# Patient Record
Sex: Female | Born: 1970 | Race: White | Hispanic: No | Marital: Single | Smoking: Current every day smoker
Health system: Southern US, Community
[De-identification: ages and names within clinical notes are randomized; demographics above are authoritative.]

## PROBLEM LIST (undated history)

## (undated) DIAGNOSIS — J449 Chronic obstructive pulmonary disease, unspecified: Secondary | ICD-10-CM

## (undated) DIAGNOSIS — R011 Cardiac murmur, unspecified: Secondary | ICD-10-CM

## (undated) HISTORY — PX: CHOLECYSTECTOMY: SHX55

---

## 2004-05-10 ENCOUNTER — Emergency Department: Payer: Self-pay | Admitting: Internal Medicine

## 2006-03-28 ENCOUNTER — Emergency Department: Payer: Self-pay | Admitting: Emergency Medicine

## 2006-04-30 ENCOUNTER — Emergency Department: Payer: Self-pay

## 2007-01-13 ENCOUNTER — Emergency Department: Payer: Self-pay | Admitting: Emergency Medicine

## 2008-02-23 ENCOUNTER — Other Ambulatory Visit: Payer: Self-pay

## 2008-02-23 ENCOUNTER — Emergency Department: Payer: Self-pay | Admitting: Internal Medicine

## 2008-03-04 ENCOUNTER — Emergency Department (HOSPITAL_COMMUNITY): Admission: EM | Admit: 2008-03-04 | Discharge: 2008-03-04 | Payer: Self-pay | Admitting: Emergency Medicine

## 2009-01-25 ENCOUNTER — Emergency Department (HOSPITAL_COMMUNITY): Admission: EM | Admit: 2009-01-25 | Discharge: 2009-01-25 | Payer: Self-pay | Admitting: Emergency Medicine

## 2009-06-28 ENCOUNTER — Emergency Department (HOSPITAL_COMMUNITY): Admission: EM | Admit: 2009-06-28 | Discharge: 2009-06-29 | Payer: Self-pay | Admitting: Emergency Medicine

## 2010-12-20 ENCOUNTER — Emergency Department (HOSPITAL_COMMUNITY)
Admission: EM | Admit: 2010-12-20 | Discharge: 2010-12-20 | Disposition: A | Discharge status: Home / Self Care | Payer: Self-pay | Attending: Emergency Medicine | Admitting: Emergency Medicine

## 2010-12-20 DIAGNOSIS — J45909 Unspecified asthma, uncomplicated: Secondary | ICD-10-CM | POA: Insufficient documentation

## 2010-12-20 DIAGNOSIS — Y92009 Unspecified place in unspecified non-institutional (private) residence as the place of occurrence of the external cause: Secondary | ICD-10-CM | POA: Insufficient documentation

## 2010-12-20 DIAGNOSIS — R404 Transient alteration of awareness: Secondary | ICD-10-CM | POA: Insufficient documentation

## 2011-11-14 ENCOUNTER — Emergency Department: Payer: Self-pay | Admitting: Emergency Medicine

## 2011-11-14 LAB — URINALYSIS, COMPLETE
Ketone: NEGATIVE
RBC,UR: 3 /HPF (ref 0–5)
WBC UR: 5 /HPF (ref 0–5)

## 2011-11-14 LAB — COMPREHENSIVE METABOLIC PANEL
Albumin: 3.6 g/dL (ref 3.4–5.0)
Anion Gap: 5 — ABNORMAL LOW (ref 7–16)
BUN: 11 mg/dL (ref 7–18)
Bilirubin,Total: 0.3 mg/dL (ref 0.2–1.0)
Chloride: 102 mmol/L (ref 98–107)
Co2: 30 mmol/L (ref 21–32)
EGFR (African American): >60
Osmolality: 274 (ref 275–301)
Potassium: 4.2 mmol/L (ref 3.5–5.1)
Sodium: 137 mmol/L (ref 136–145)

## 2011-11-14 LAB — CBC
HCT: 43.5 % (ref 35.0–47.0)
MCHC: 33.1 g/dL (ref 32.0–36.0)
RBC: 4.49 10*6/uL (ref 3.80–5.20)

## 2011-11-14 LAB — PREGNANCY, URINE: Pregnancy Test, Urine: NEGATIVE m[IU]/mL

## 2011-11-14 LAB — LIPASE, BLOOD: Lipase: 129 U/L (ref 73–393)

## 2013-01-12 ENCOUNTER — Emergency Department: Payer: Self-pay | Admitting: Emergency Medicine

## 2014-01-21 ENCOUNTER — Emergency Department: Payer: Self-pay | Admitting: Emergency Medicine

## 2014-01-22 LAB — COMPREHENSIVE METABOLIC PANEL
ALK PHOS: 65 U/L
ANION GAP: 2 — AB (ref 7–16)
Albumin: 3.4 g/dL (ref 3.4–5.0)
BUN: 11 mg/dL (ref 7–18)
Bilirubin,Total: 0.2 mg/dL (ref 0.2–1.0)
CHLORIDE: 110 mmol/L — AB (ref 98–107)
Calcium, Total: 7.9 mg/dL — ABNORMAL LOW (ref 8.5–10.1)
Co2: 29 mmol/L (ref 21–32)
Creatinine: 0.74 mg/dL (ref 0.60–1.30)
EGFR (African American): >60
GLUCOSE: 85 mg/dL (ref 65–99)
OSMOLALITY: 280 (ref 275–301)
POTASSIUM: 4.1 mmol/L (ref 3.5–5.1)
SGOT(AST): 18 U/L (ref 15–37)
SGPT (ALT): 15 U/L (ref 12–78)
SODIUM: 141 mmol/L (ref 136–145)
Total Protein: 6.5 g/dL (ref 6.4–8.2)

## 2014-01-22 LAB — CBC
HCT: 43.1 % (ref 35.0–47.0)
HGB: 14.1 g/dL (ref 12.0–16.0)
MCH: 33.6 pg (ref 26.0–34.0)
MCHC: 32.6 g/dL (ref 32.0–36.0)
MCV: 103 fL — AB (ref 80–100)
PLATELETS: 244 10*3/uL (ref 150–440)
RBC: 4.18 10*6/uL (ref 3.80–5.20)
RDW: 13.2 % (ref 11.5–14.5)
WBC: 6.5 10*3/uL (ref 3.6–11.0)

## 2014-01-22 LAB — LIPASE, BLOOD: LIPASE: 193 U/L (ref 73–393)

## 2014-04-26 ENCOUNTER — Emergency Department: Payer: Self-pay | Admitting: Emergency Medicine

## 2014-04-26 LAB — CBC WITH DIFFERENTIAL/PLATELET
BASOS PCT: 0.8 %
Basophil #: 0.1 10*3/uL (ref 0.0–0.1)
Eosinophil #: 0.1 10*3/uL (ref 0.0–0.7)
Eosinophil %: 0.8 %
HCT: 47 % (ref 35.0–47.0)
HGB: 15.7 g/dL (ref 12.0–16.0)
LYMPHS PCT: 22.1 %
Lymphocyte #: 1.6 10*3/uL (ref 1.0–3.6)
MCH: 33.8 pg (ref 26.0–34.0)
MCHC: 33.3 g/dL (ref 32.0–36.0)
MCV: 102 fL — ABNORMAL HIGH (ref 80–100)
MONO ABS: 0.4 x10 3/mm (ref 0.2–0.9)
Monocyte %: 5.4 %
Neutrophil #: 5.3 10*3/uL (ref 1.4–6.5)
Neutrophil %: 70.9 %
PLATELETS: 273 10*3/uL (ref 150–440)
RBC: 4.63 10*6/uL (ref 3.80–5.20)
RDW: 12.9 % (ref 11.5–14.5)
WBC: 7.5 10*3/uL (ref 3.6–11.0)

## 2014-04-26 LAB — URINALYSIS, COMPLETE
Bacteria: NONE SEEN
Bilirubin,UR: NEGATIVE
Glucose,UR: NEGATIVE mg/dL (ref 0–75)
Leukocyte Esterase: NEGATIVE
NITRITE: NEGATIVE
Ph: 5 (ref 4.5–8.0)
Protein: NEGATIVE
RBC,UR: 13 /HPF (ref 0–5)
SPECIFIC GRAVITY: 1.03 (ref 1.003–1.030)
Squamous Epithelial: 4

## 2014-04-26 LAB — COMPREHENSIVE METABOLIC PANEL
ALBUMIN: 3.3 g/dL — AB (ref 3.4–5.0)
ANION GAP: 6 — AB (ref 7–16)
AST: 22 U/L (ref 15–37)
Alkaline Phosphatase: 73 U/L
BUN: 18 mg/dL (ref 7–18)
Bilirubin,Total: 0.3 mg/dL (ref 0.2–1.0)
CO2: 26 mmol/L (ref 21–32)
Calcium, Total: 8.3 mg/dL — ABNORMAL LOW (ref 8.5–10.1)
Chloride: 110 mmol/L — ABNORMAL HIGH (ref 98–107)
Creatinine: 0.68 mg/dL (ref 0.60–1.30)
GLUCOSE: 102 mg/dL — AB (ref 65–99)
OSMOLALITY: 285 (ref 275–301)
POTASSIUM: 3.6 mmol/L (ref 3.5–5.1)
SGPT (ALT): 15 U/L
SODIUM: 142 mmol/L (ref 136–145)
TOTAL PROTEIN: 6.6 g/dL (ref 6.4–8.2)

## 2014-04-26 LAB — LIPASE, BLOOD: LIPASE: 90 U/L (ref 73–393)

## 2015-05-11 IMAGING — CT CT ABD-PELV W/ CM
2 of 5 series · 15 of 46 positions shown, 17 images · IV contrast (agent unspecified)
Comparison: None.

CLINICAL DATA: Sharp mid abdominal pain starting 2 days ago.
Vomiting. History of ulcers.

EXAM:
CT ABDOMEN AND PELVIS WITH CONTRAST
TECHNIQUE: Multidetector CT imaging of the abdomen and pelvis was performed
using the standard protocol following bolus administration of
intravenous contrast.
CONTRAST:  75 mL of Fsovue-Q22 intravenous contrast

[Series 2: routine abd pel with · axial · 0.57mm/px · z∈[-460,-104]mm · 12 of 81 slices shown, 14 images]
[im 5/81  soft-tissue]
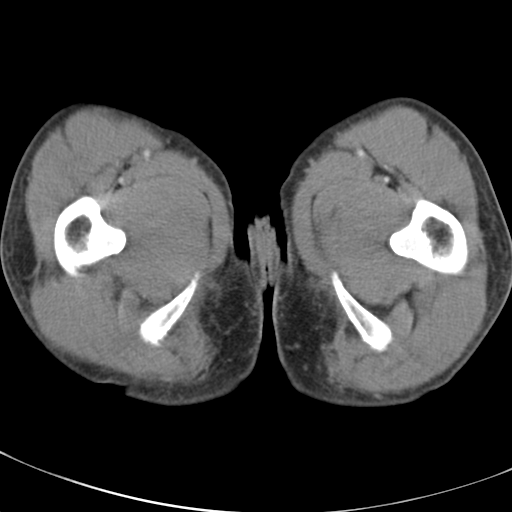
[im 5/81  bone]
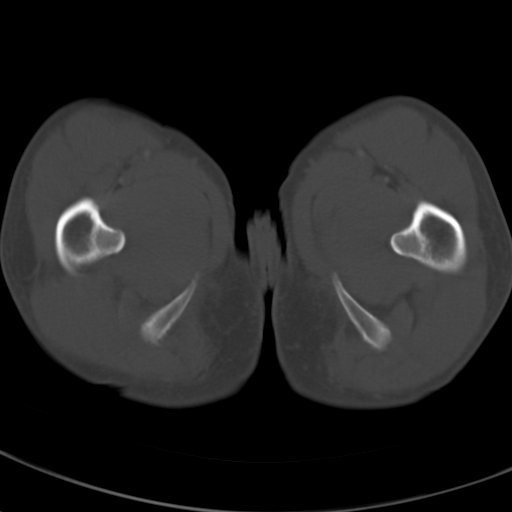
[im 13/81  soft-tissue]
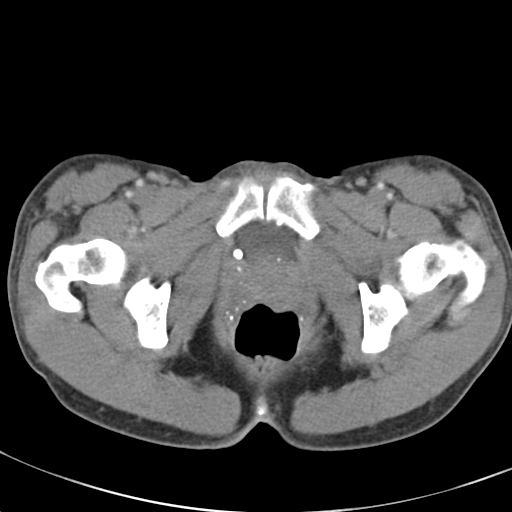
[im 17/81  soft-tissue]
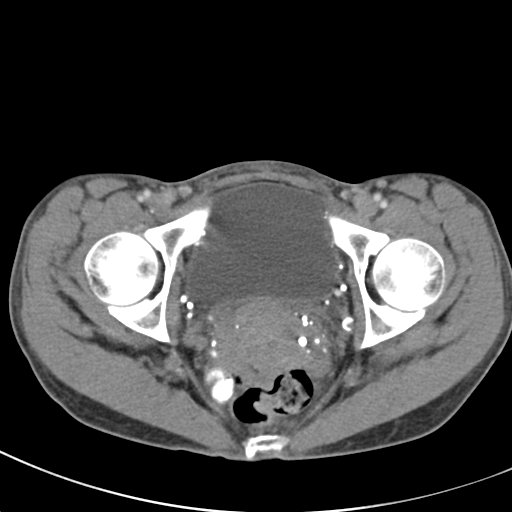
[im 26/81  soft-tissue]
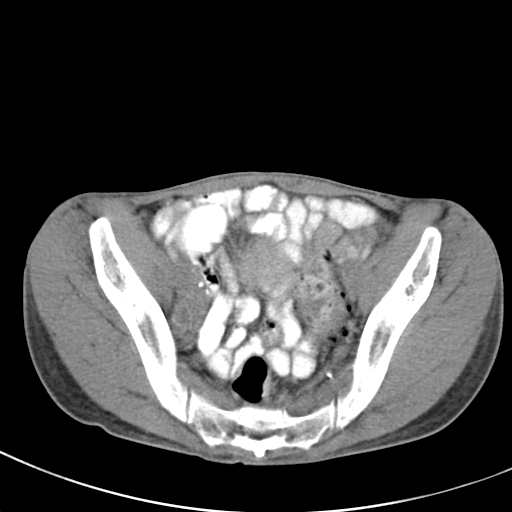
[im 30/81  soft-tissue]
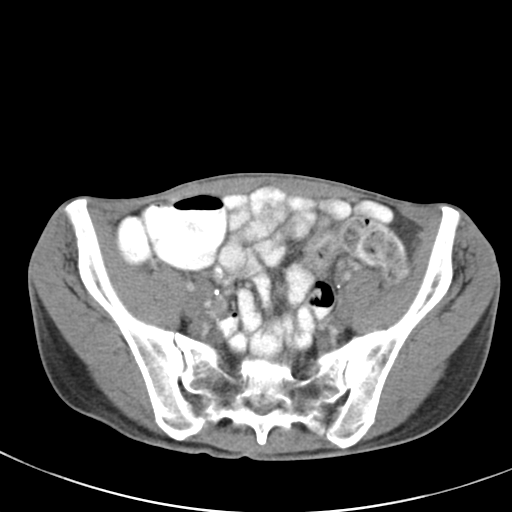
[im 38/81  soft-tissue]
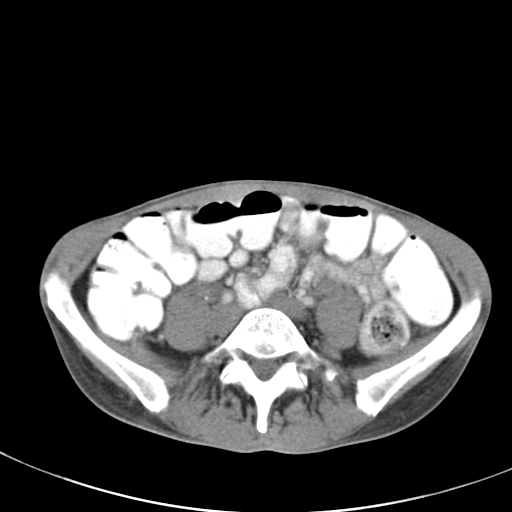
[im 43/81  soft-tissue]
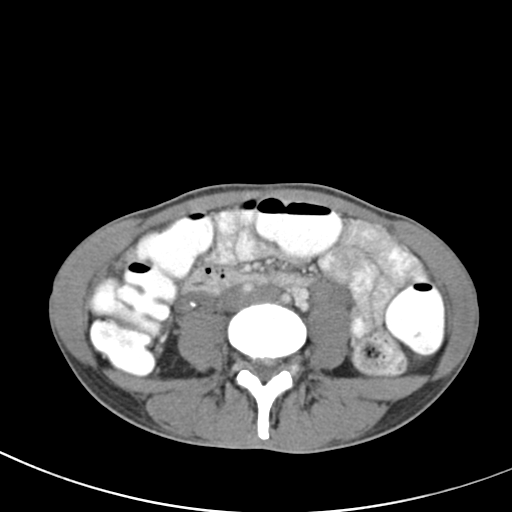
[im 51/81  soft-tissue]
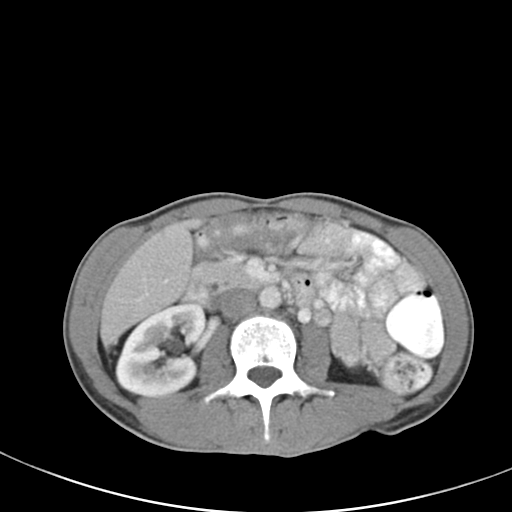
[im 55/81  soft-tissue]
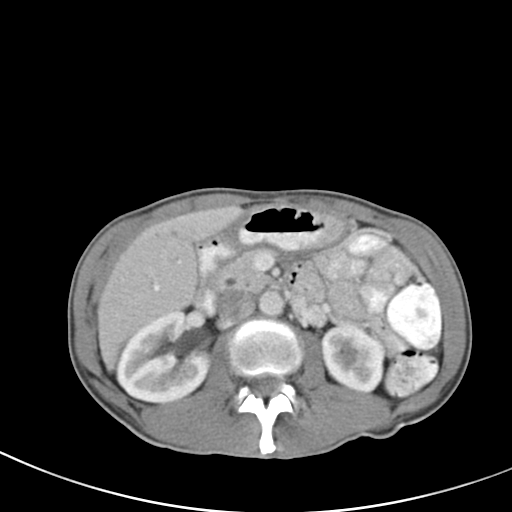
[im 55/81  bone]
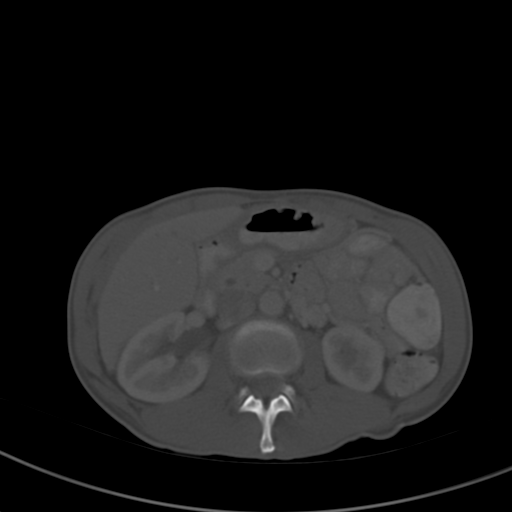
[im 64/81  soft-tissue]
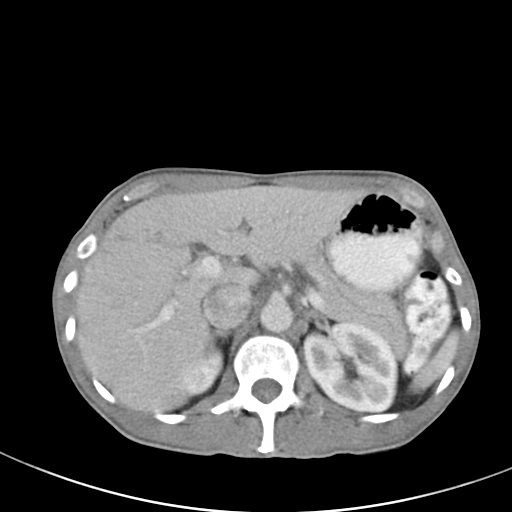
[im 68/81  soft-tissue]
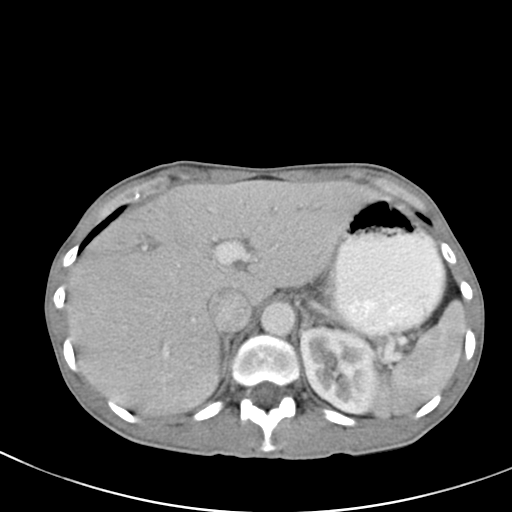
[im 76/81  soft-tissue]
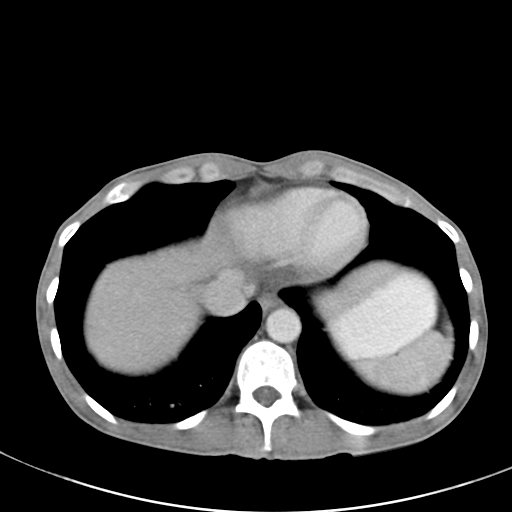

[Series 5: cor routine abd pel with · coronal · 0.51mm/px · 3 of 87 slices shown]
[im 29/87  soft-tissue]
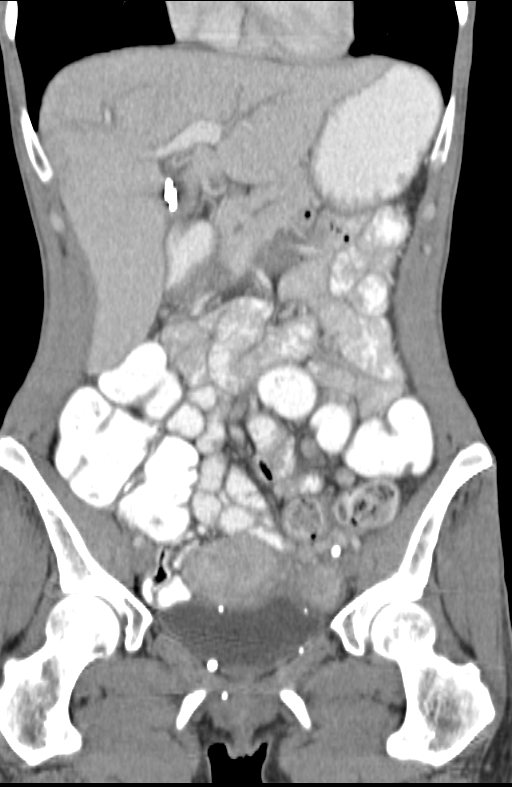
[im 39/87  soft-tissue]
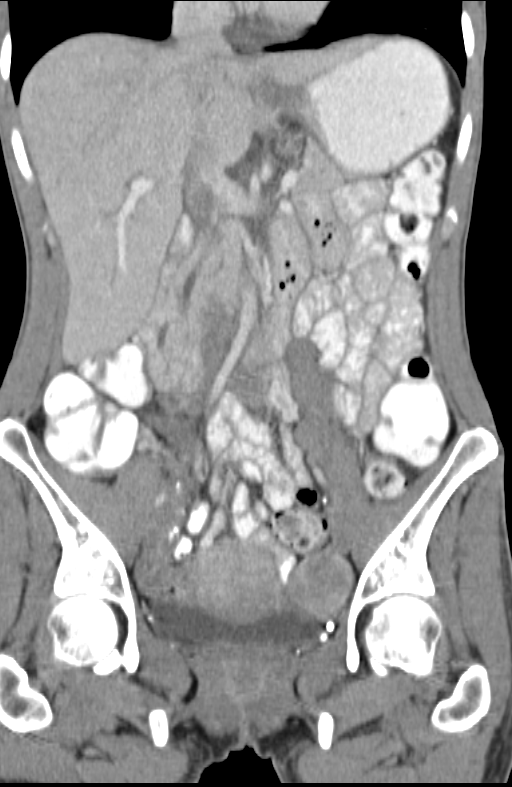
[im 48/87  soft-tissue]
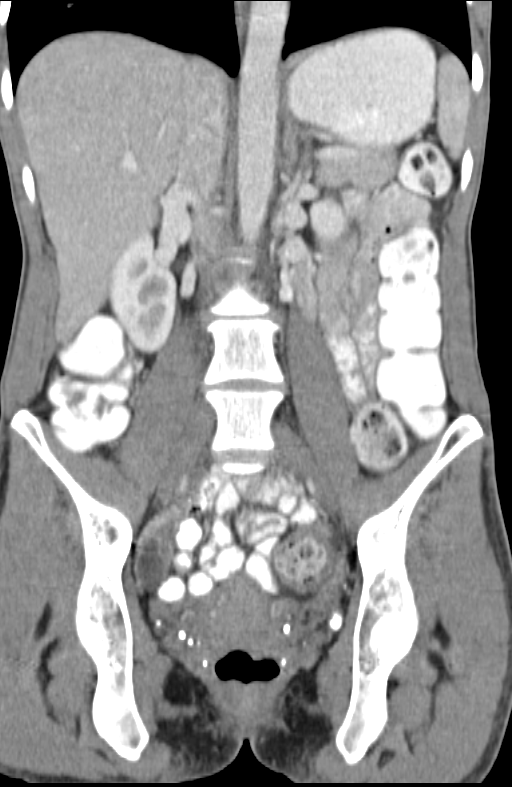

[15 of 46 positions shown; findings below may reference images not displayed]

FINDINGS: Minimal subsegmental atelectasis at the lung bases. Heart is normal
in size.

Liver is normal in size in overall attenuation. No liver mass.
Low-attenuation tracks along the portal veins. This is nonspecific.
Some of this appears to be mild intrahepatic bile duct dilation in
this post cholecystectomy patient. Mild periportal edema is
possible, which can be seen with hepatic inflammation. Recommend
corrlative liver function test evaluation.

Normal spleen. Common bile duct measures 6 mm but tapers distally.
Normal pancreas. No adrenal masses. Normal kidneys, ureters and
bladder. Uterus, ovaries and adnexa are unremarkable.

There are prominent veins along the anterior cell us muscles, which
appear primarily to be the gonadal veins. This is nonspecific.

Mild increased stool left colon. Colon is otherwise unremarkable.
Normal small bowel. No mesenteric inflammatory change. Normal
appendix visualized.

Broad-based disc protrusion at L4-L5 which encroaches upon the
lateral recesses and central spinal canal. If there are radicular
symptoms, followup lumbar MRI would be recommended.
IMPRESSION: 1. Possible hepatic inflammation as described above. Correlate with
liver function tests.
2. No other evidence of an acute abnormality.
3. Mild increased stool in the left colon.
4. Status post cholecystectomy.
5. Broad-based disc protrusions centrally at the L4-L5 level.

## 2016-01-24 ENCOUNTER — Emergency Department
Admission: EM | Admit: 2016-01-24 | Discharge: 2016-01-24 | Discharge status: Against Medical Advice | Payer: Medicaid Other | Attending: Student | Admitting: Student

## 2016-01-24 ENCOUNTER — Emergency Department
Admission: EM | Admit: 2016-01-24 | Discharge: 2016-01-24 | Disposition: A | Discharge status: Home / Self Care | Payer: Medicaid Other | Source: Home / Self Care

## 2016-01-24 ENCOUNTER — Encounter: Payer: Self-pay | Admitting: *Deleted

## 2016-01-24 ENCOUNTER — Encounter: Payer: Self-pay | Admitting: Emergency Medicine

## 2016-01-24 DIAGNOSIS — M25552 Pain in left hip: Secondary | ICD-10-CM | POA: Insufficient documentation

## 2016-01-24 DIAGNOSIS — F17200 Nicotine dependence, unspecified, uncomplicated: Secondary | ICD-10-CM | POA: Diagnosis not present

## 2016-01-24 DIAGNOSIS — K625 Hemorrhage of anus and rectum: Secondary | ICD-10-CM

## 2016-01-24 DIAGNOSIS — J449 Chronic obstructive pulmonary disease, unspecified: Secondary | ICD-10-CM | POA: Diagnosis not present

## 2016-01-24 DIAGNOSIS — Z5321 Procedure and treatment not carried out due to patient leaving prior to being seen by health care provider: Secondary | ICD-10-CM | POA: Insufficient documentation

## 2016-01-24 DIAGNOSIS — M25551 Pain in right hip: Secondary | ICD-10-CM | POA: Insufficient documentation

## 2016-01-24 DIAGNOSIS — M25559 Pain in unspecified hip: Secondary | ICD-10-CM

## 2016-01-24 HISTORY — DX: Chronic obstructive pulmonary disease, unspecified: J44.9

## 2016-01-24 HISTORY — DX: Cardiac murmur, unspecified: R01.1

## 2016-01-24 LAB — COMPREHENSIVE METABOLIC PANEL
ALK PHOS: 69 U/L (ref 38–126)
ALT: 28 U/L (ref 14–54)
ANION GAP: 5 (ref 5–15)
AST: 44 U/L — ABNORMAL HIGH (ref 15–41)
Albumin: 3.8 g/dL (ref 3.5–5.0)
BILIRUBIN TOTAL: 0.5 mg/dL (ref 0.3–1.2)
BUN: 15 mg/dL (ref 6–20)
CALCIUM: 8.9 mg/dL (ref 8.9–10.3)
CO2: 29 mmol/L (ref 22–32)
CREATININE: 0.8 mg/dL (ref 0.44–1.00)
Chloride: 105 mmol/L (ref 101–111)
Glucose, Bld: 129 mg/dL — ABNORMAL HIGH (ref 65–99)
Potassium: 4.1 mmol/L (ref 3.5–5.1)
SODIUM: 139 mmol/L (ref 135–145)
TOTAL PROTEIN: 6.8 g/dL (ref 6.5–8.1)

## 2016-01-24 LAB — CBC
HCT: 45.1 % (ref 35.0–47.0)
HEMOGLOBIN: 15.8 g/dL (ref 12.0–16.0)
MCH: 34.9 pg — ABNORMAL HIGH (ref 26.0–34.0)
MCHC: 35 g/dL (ref 32.0–36.0)
MCV: 99.7 fL (ref 80.0–100.0)
PLATELETS: 178 10*3/uL (ref 150–440)
RBC: 4.52 MIL/uL (ref 3.80–5.20)
RDW: 13.2 % (ref 11.5–14.5)
WBC: 4.4 10*3/uL (ref 3.6–11.0)

## 2016-01-24 LAB — TYPE AND SCREEN
ABO/RH(D): B NEG
ANTIBODY SCREEN: NEGATIVE

## 2016-01-24 NOTE — ED Notes (Signed)
Pt sitting outside ED entrance, refusing to be seen. IV removed.

## 2016-01-24 NOTE — ED Notes (Signed)
[facility] Pt asked if still having menstrual cycle. Pt states "its been years". When asked how many years, Pt stated 6.

## 2016-01-24 NOTE — ED Notes (Signed)
[facility] Pt then walked out with NAD,  before discharge information could be reviewed.

## 2016-01-24 NOTE — ED Notes (Signed)
Pt returns after leaving earlier today for a fall, weakness, and rectal bleeding, at present pt complaining of hip pain

## 2016-01-24 NOTE — ED Notes (Signed)
x2 days with rash to the face , and hives "all over" , rectal bleeding , weakness , frequent fall, with dizziness

## 2016-01-24 NOTE — ED Notes (Signed)
Called for patient in lobby and subwait; no response.

## 2016-01-24 NOTE — ED Provider Notes (Addendum)
[facility] Emergency Department Provider Note   ____________________________________________  Time seen: Approximately 7:45 PM  I have reviewed the triage vital signs and the nursing notes.   HISTORY  Chief Complaint Fever and Rectal Bleeding  Caveat - HPI and ROS limited due to patient's refusal to cooperate. Information is obtained from her family member at bedside.  HPI [name] is a 45 y.o. female with history of COPD who presents for bilateral hip pain, rectal bleeding since yesterday, intermittent, moderate, hip pain is worse with movement. Her family member reports that yesterday she fell. Today she again fell. Her family members concerned that she had a fever last night with a max temperature of 100. She came to the emergency room and earlier today had labs drawn but left after refusing to be seen. She has returned for evaluation. She reports that she has had rectal bleeding as well though she is not able to quantify that.   Past Medical History  Diagnosis Date  . Heart murmur   . COPD (chronic obstructive pulmonary disease) (HCC)     There are no active problems to display for this patient.   Past Surgical History  Procedure Laterality Date  . Cholecystectomy    . Cesarean section      No current outpatient prescriptions on file.  Allergies Aleve and Doxycycline  History reviewed. No pertinent family history.  Social History Social History  Substance Use Topics  . Smoking status: Current Every Day Smoker  . Smokeless tobacco: None  . Alcohol Use: None    Review of Systems  HPI and ROS limited due to patient's refusal to cooperate. Information is obtained from her family member at bedside. ____________________________________________   PHYSICAL EXAM:  VITAL SIGNS: ED Triage Vitals  Enc Vitals Group     BP 01/24/16 1635 108/78 mmHg     Pulse Rate 01/24/16 1635 92     Resp 01/24/16 1635 18     Temp 01/24/16 1635  98.4 F (36.9 C)     Temp Source 01/24/16 1635 Oral     SpO2 01/24/16 1635 92 %     Weight 01/24/16 1635 83 lb (37.649 kg)     Height 01/24/16 1635 5' (1.524 m)     Head Cir --      Peak Flow --      Pain Score 01/24/16 1636 7     Pain Loc --      Pain Edu? --      Excl. in GC? --     Constitutional: Sleeping but awakens to voice and light touch. Nontoxic appearing and in no acute distress. Eyes: Conjunctivae are normal. PERRL. EOMI. Head: Atraumatic. Nose: No congestion/rhinnorhea. Mouth/Throat: Mucous membranes are moist.  Oropharynx non-erythematous. Neck: No stridor. Supple without meningismus. Cardiovascular: Normal rate, regular rhythm. Grossly normal heart sounds.  Good peripheral circulation. Respiratory: Normal respiratory effort.  No retractions. Lungs CTAB. Gastrointestinal: Soft and nontender. No distention.  No CVA tenderness. Genitourinary: Deferred Musculoskeletal: Pelvis is stable to rock and compression. She has full active motion at the hip joints bilaterally though extreme flexion does elicit some pain. Neurologic:  Normal speech and language. No gross focal neurologic deficits are appreciated. No gait instability. Skin:  Skin is warm, dry and intact. No rash noted. Psychiatric: Mood and affect are normal. Speech and behavior are normal.  ____________________________________________   LABS (all labs ordered are listed, but only abnormal results are displayed)  Labs Reviewed - No data to  display ____________________________________________  EKG  ED ECG REPORT I, Gayla DossGayle, Mahala Rommel A, the attending physician, personally viewed and interpreted this ECG.   Date: 01/24/2016  EKG Time: 18:28  Rate: 69  Rhythm: normal sinus rhythm  Axis: normal  Intervals:none  ST&T Change: No acute ST elevation or acute ST depression. Q wave in  V2.  ____________________________________________  RADIOLOGY  none ____________________________________________   PROCEDURES  Procedure(s) performed: None  Procedures  Critical Care performed: No  ____________________________________________   INITIAL IMPRESSION / ASSESSMENT AND PLAN / ED COURSE  Pertinent labs & imaging results that were available during my care of the patient were reviewed by me and considered in my medical decision making (see chart for details).    ----------------------------------------- 7:06 PM on 01/24/2016 ----------------------------------------- I was delayed in evaluating the patient secondary to delivering care to a critically ill patient. When I entered the room, the patient was sleeping. I woke her from sleep and introduced myself to her. I explained that I had been taking care of a critically ill patient. She reports to me "I've been fucking waiting for 6 hours and no one has done anything for my pain". I apologized and attempted to ascertain the nature of her pain. She discussed with me that she was having hip pain in bilateral hips, she was concerned that she was bleeding from her rectum. I performed a physical examination, she has a benign pulmonary examination, benign abdominal examination. I discussed with her that I would need to perform a rectal examination to see if there was ongoing bleeding from her GI tract. She adamantly refused stating "there is no way in hell you are putting a finger in my ass". I discussed with her that this was a standard part of the examination if there is concern for ongoing rectal bleeding however she still has refused. She became quite agitated and upset during my assessment. Her family member at bedside tried to console and re-direct her. I discussed with her that for her hip pain we would first try nonnarcotic pain options such as Motrin or Tylenol while we wait for an x-ray. She responded "I already have that shit  in my purse" after which she began taking off all her cardiac leads and removing herself from the monitor. She will not stay for x-ray and said to me "ya'll should have done that shit 6 hours ago when I got here". She has refused to contribute to the conversation any longer. I discussed with her and her family that I'm not able to do anything for her without her cooperation. She will not cooperate with exam, has removed all of her leads, dressed herself and is leaving. I encouraged her to follow up with her primary care doctor or return if she changed her mind and wanted an additional evaluation. I reviewed her labs which were drawn earlier today and [facility] were unremarkable with the exception of a very mild AST elevation. Her vital signs here in the emergency department were stable.   ____________________________________________   FINAL CLINICAL IMPRESSION(S) / ED DIAGNOSES  Final diagnoses:  Hip pain, unspecified laterality  Rectal bleeding      NEW MEDICATIONS STARTED DURING THIS VISIT:  There are no discharge medications for this patient.    Note:  This document was prepared using Dragon voice recognition software and may include unintentional dictation errors.    Gayla DossEryka A Shulem Mader, MD 01/24/16 [id number]  Gayla DossEryka A Shomari Matusik, MD 01/24/16 [phone]

## 2017-09-05 ENCOUNTER — Other Ambulatory Visit: Payer: Self-pay | Admitting: Family Medicine

## 2017-09-05 DIAGNOSIS — N6310 Unspecified lump in the right breast, unspecified quadrant: Secondary | ICD-10-CM

## 2017-09-12 ENCOUNTER — Other Ambulatory Visit: Payer: Self-pay

## 2017-09-22 ENCOUNTER — Other Ambulatory Visit: Payer: Self-pay

## 2017-10-08 ENCOUNTER — Ambulatory Visit: Admit: 2017-10-08 | Payer: Self-pay | Admitting: Internal Medicine

## 2017-10-08 SURGERY — COLONOSCOPY WITH PROPOFOL
Anesthesia: General

## 2020-10-06 DEATH — deceased
